# Patient Record
Sex: Female | Born: 1960 | Hispanic: Yes | Marital: Married | Smoking: Never smoker
Health system: Southern US, Community
[De-identification: ages and names within clinical notes are randomized; demographics above are authoritative.]

## PROBLEM LIST (undated history)

## (undated) DIAGNOSIS — K7581 Nonalcoholic steatohepatitis (NASH): Secondary | ICD-10-CM

## (undated) DIAGNOSIS — M549 Dorsalgia, unspecified: Secondary | ICD-10-CM

## (undated) DIAGNOSIS — Z923 Personal history of irradiation: Secondary | ICD-10-CM

## (undated) DIAGNOSIS — I10 Essential (primary) hypertension: Secondary | ICD-10-CM

## (undated) DIAGNOSIS — E079 Disorder of thyroid, unspecified: Secondary | ICD-10-CM

## (undated) HISTORY — PX: BACK SURGERY: SHX140

---

## 2015-08-28 ENCOUNTER — Emergency Department (HOSPITAL_COMMUNITY)
Admission: EM | Admit: 2015-08-28 | Discharge: 2015-08-28 | Disposition: A | Discharge status: Home / Self Care | Payer: Medicare HMO | Attending: Emergency Medicine | Admitting: Emergency Medicine

## 2015-08-28 ENCOUNTER — Encounter (HOSPITAL_COMMUNITY): Payer: Self-pay

## 2015-08-28 DIAGNOSIS — M549 Dorsalgia, unspecified: Secondary | ICD-10-CM | POA: Diagnosis present

## 2015-08-28 DIAGNOSIS — I10 Essential (primary) hypertension: Secondary | ICD-10-CM | POA: Diagnosis not present

## 2015-08-28 DIAGNOSIS — Z880 Allergy status to penicillin: Secondary | ICD-10-CM | POA: Diagnosis not present

## 2015-08-28 DIAGNOSIS — Z8719 Personal history of other diseases of the digestive system: Secondary | ICD-10-CM | POA: Diagnosis not present

## 2015-08-28 DIAGNOSIS — Z9889 Other specified postprocedural states: Secondary | ICD-10-CM | POA: Diagnosis not present

## 2015-08-28 DIAGNOSIS — Z8639 Personal history of other endocrine, nutritional and metabolic disease: Secondary | ICD-10-CM | POA: Insufficient documentation

## 2015-08-28 DIAGNOSIS — M5432 Sciatica, left side: Secondary | ICD-10-CM | POA: Diagnosis not present

## 2015-08-28 HISTORY — DX: Disorder of thyroid, unspecified: E07.9

## 2015-08-28 HISTORY — DX: Dorsalgia, unspecified: M54.9

## 2015-08-28 HISTORY — DX: Nonalcoholic steatohepatitis (NASH): K75.81

## 2015-08-28 HISTORY — DX: Essential (primary) hypertension: I10

## 2015-08-28 MED ORDER — NAPROXEN 500 MG PO TABS: 500.0000 mg | ORAL_TABLET | Freq: Two times a day (BID) | ORAL | Status: AC

## 2015-08-28 MED ORDER — DIAZEPAM 5 MG PO TABS: 5.0000 mg | ORAL_TABLET | Freq: Two times a day (BID) | ORAL | Status: AC

## 2015-08-28 MED ORDER — KETOROLAC TROMETHAMINE 60 MG/2ML IM SOLN
60.0000 mg | Freq: Once | INTRAMUSCULAR | Status: AC
  Administered 2015-08-28: 60 mg via INTRAMUSCULAR
  Filled 2015-08-28: qty 2

## 2015-08-28 MED ORDER — DIAZEPAM 5 MG PO TABS
5.0000 mg | ORAL_TABLET | Freq: Once | ORAL | Status: AC
  Administered 2015-08-28: 5 mg via ORAL
  Filled 2015-08-28: qty 1

## 2015-08-28 NOTE — ED Notes (Signed)
Declined W/C at D/C and was escorted to lobby by RN. 

## 2015-08-28 NOTE — ED Notes (Signed)
Pt taking Relafen for pain with no relief of pain

## 2015-08-28 NOTE — Discharge Instructions (Signed)
Ms. Diane Robles,  [place] Please follow-up with your primary care provider. Return to the emergency department if you develop fevers, chills, loss of bladder/bowel control, increased pain, inability to walk, new/worsening symptoms. Feel better soon!  S. Lane HackerNicole Shaquisha Wynn, PA-C

## 2015-08-28 NOTE — ED Provider Notes (Signed)
CSN: [id number]     Arrival date & time 08/28/15  1129 History   First MD Initiated Contact with Patient 08/28/15 1240     Chief Complaint  Patient presents with  . Back Pain   HPI   Diane Robles is a 55 y.o. female PMH significant for back pain, HTN presenting with a 3 day history of left sided back pain.  She describes her pain as 10/10 pain scale, constant, radiating down left leg, similar to sciatica pain she's had in the past, worsened with working in her garden. She denies fevers, chills, nausea, vomiting, loss of bowel or bladder control.  Past Medical History  Diagnosis Date  . Back pain   . Hypertension   . Thyroid disease   . NASH (nonalcoholic steatohepatitis)    Past Surgical History  Procedure Laterality Date  . Back surgery     No family history on file. Social History  Substance Use Topics  . Smoking status: Never Smoker   . Smokeless tobacco: None  . Alcohol Use: None   OB History    No data available     Review of Systems  Ten systems are reviewed and are negative for acute change except as noted in the HPI  Allergies  Ivp dye; Penicillins; and Sulfa antibiotics  Home Medications   Prior to Admission medications   Not on File   BP 157/84 mmHg  Pulse 72  Temp(Src) 98.6 F (37 C) (Oral)  Resp 18  Ht 5\' 2"  (1.575 m)  Wt 68.04 kg  BMI 27.43 kg/m2  SpO2 100% Physical Exam  Constitutional: She is oriented to person, place, and time. She appears well-developed and well-nourished. No distress.  HENT:  Head: Normocephalic and atraumatic.  Eyes: Conjunctivae are normal. Right eye exhibits no discharge. Left eye exhibits no discharge. No scleral icterus.  Neck: No tracheal deviation present.  Cardiovascular: Normal rate.   Pulmonary/Chest: Effort normal. No respiratory distress.  Abdominal: Soft. She exhibits no distension.  Musculoskeletal: She exhibits tenderness. She exhibits no edema.  Left SI tenderness  Lymphadenopathy:    She  has no cervical adenopathy.  Neurological: She is alert and oriented to person, place, and time. No cranial nerve deficit. Coordination normal.  Skin: Skin is warm and dry. No rash noted. She is not diaphoretic. No erythema.  Psychiatric: She has a normal mood and affect. Her behavior is normal.  Nursing note and vitals reviewed.   ED Course  Procedures   MDM   Final diagnoses:  Sciatica of left side   Normal neurological exam, no evidence of urinary incontinence or retention, pain is consistently reproducible. There is no evidence of AAA or concern for dissection at this time.   Patient can walk but states is painful.  No loss of bowel or bladder control.  No concern for cauda equina.  No fever, night sweats, weight loss, h/o cancer, IVDU.  Pain treated here in the department with adequate improvement. RICE protocol and pain medicine indicated and discussed with patient. I have also discussed reasons to return immediately to the ER.  Patient expresses understanding and agrees with plan.    Melton KrebsSamantha Nicole Zakkary Thibault, PA-C 08/28/15 1318  Pricilla LovelessScott Goldston, MD 08/29/15 [phone]

## 2015-08-28 NOTE — ED Notes (Addendum)
Patient here with complaint of back pain for the past 2-3 days. Pain more  left sided pain radiating down left leg. Has history of back problems. Thinks working in the garden aggravated her back. Pain with any ambulation. Will need interpretor phone

## 2016-09-07 HISTORY — PX: BREAST LUMPECTOMY: SHX2

## 2016-11-07 HISTORY — PX: BREAST LUMPECTOMY: SHX2

## 2018-11-29 ENCOUNTER — Other Ambulatory Visit: Payer: Self-pay

## 2018-11-29 DIAGNOSIS — Z20822 Contact with and (suspected) exposure to covid-19: Secondary | ICD-10-CM

## 2018-11-30 LAB — NOVEL CORONAVIRUS, NAA: SARS-CoV-2, NAA: NOT DETECTED

## 2019-05-11 ENCOUNTER — Other Ambulatory Visit: Payer: Self-pay | Admitting: Radiation Oncology

## 2019-05-12 ENCOUNTER — Other Ambulatory Visit: Payer: Self-pay | Admitting: Internal Medicine

## 2019-05-12 DIAGNOSIS — R921 Mammographic calcification found on diagnostic imaging of breast: Secondary | ICD-10-CM

## 2019-05-12 DIAGNOSIS — Z853 Personal history of malignant neoplasm of breast: Secondary | ICD-10-CM

## 2019-05-12 DIAGNOSIS — Z9889 Other specified postprocedural states: Secondary | ICD-10-CM

## 2019-05-14 ENCOUNTER — Ambulatory Visit
Admission: RE | Admit: 2019-05-14 | Discharge: 2019-05-14 | Disposition: A | Discharge status: Home / Self Care | Payer: Medicare HMO | Source: Ambulatory Visit | Attending: Internal Medicine | Admitting: Internal Medicine

## 2019-05-14 ENCOUNTER — Other Ambulatory Visit: Payer: Self-pay | Admitting: Internal Medicine

## 2019-05-14 ENCOUNTER — Other Ambulatory Visit: Payer: Self-pay

## 2019-05-14 DIAGNOSIS — Z9889 Other specified postprocedural states: Secondary | ICD-10-CM

## 2019-05-14 DIAGNOSIS — Z853 Personal history of malignant neoplasm of breast: Secondary | ICD-10-CM

## 2019-05-14 DIAGNOSIS — R921 Mammographic calcification found on diagnostic imaging of breast: Secondary | ICD-10-CM

## 2019-05-14 HISTORY — DX: Personal history of irradiation: Z92.3

## 2019-05-18 ENCOUNTER — Encounter (HOSPITAL_COMMUNITY): Payer: Self-pay

## 2019-11-13 ENCOUNTER — Ambulatory Visit
Admission: RE | Admit: 2019-11-13 | Discharge: 2019-11-13 | Disposition: A | Discharge status: Home / Self Care | Payer: Medicare HMO | Source: Ambulatory Visit | Attending: Internal Medicine | Admitting: Internal Medicine

## 2019-11-13 ENCOUNTER — Other Ambulatory Visit: Payer: Self-pay | Admitting: Internal Medicine

## 2019-11-13 ENCOUNTER — Other Ambulatory Visit: Payer: Self-pay

## 2019-11-13 DIAGNOSIS — Z853 Personal history of malignant neoplasm of breast: Secondary | ICD-10-CM

## 2019-11-13 DIAGNOSIS — Z9889 Other specified postprocedural states: Secondary | ICD-10-CM

## 2019-11-13 DIAGNOSIS — R921 Mammographic calcification found on diagnostic imaging of breast: Secondary | ICD-10-CM

## 2019-11-17 ENCOUNTER — Ambulatory Visit
Admission: RE | Admit: 2019-11-17 | Discharge: 2019-11-17 | Disposition: A | Discharge status: Home / Self Care | Payer: Medicare HMO | Source: Ambulatory Visit | Attending: Internal Medicine | Admitting: Internal Medicine

## 2019-11-17 ENCOUNTER — Other Ambulatory Visit: Payer: Self-pay

## 2019-11-17 DIAGNOSIS — Z9889 Other specified postprocedural states: Secondary | ICD-10-CM

## 2019-11-17 DIAGNOSIS — Z853 Personal history of malignant neoplasm of breast: Secondary | ICD-10-CM

## 2019-11-17 DIAGNOSIS — R921 Mammographic calcification found on diagnostic imaging of breast: Secondary | ICD-10-CM

## 2019-11-17 HISTORY — PX: BREAST BIOPSY: SHX20

## 2019-12-11 ENCOUNTER — Other Ambulatory Visit: Payer: Self-pay | Admitting: Critical Care Medicine

## 2019-12-11 ENCOUNTER — Other Ambulatory Visit: Payer: Self-pay

## 2019-12-11 DIAGNOSIS — Z20822 Contact with and (suspected) exposure to covid-19: Secondary | ICD-10-CM

## 2019-12-12 LAB — NOVEL CORONAVIRUS, NAA: SARS-CoV-2, NAA: NOT DETECTED

## 2019-12-15 ENCOUNTER — Other Ambulatory Visit: Payer: Self-pay

## 2020-11-11 ENCOUNTER — Other Ambulatory Visit: Payer: Self-pay | Admitting: Internal Medicine

## 2020-11-11 DIAGNOSIS — Z9889 Other specified postprocedural states: Secondary | ICD-10-CM

## 2020-11-21 ENCOUNTER — Other Ambulatory Visit: Payer: Self-pay | Admitting: Internal Medicine

## 2020-11-21 DIAGNOSIS — Z853 Personal history of malignant neoplasm of breast: Secondary | ICD-10-CM

## 2020-11-21 DIAGNOSIS — Z9889 Other specified postprocedural states: Secondary | ICD-10-CM

## 2020-11-24 ENCOUNTER — Other Ambulatory Visit: Payer: Self-pay

## 2020-11-24 ENCOUNTER — Ambulatory Visit
Admission: RE | Admit: 2020-11-24 | Discharge: 2020-11-24 | Disposition: A | Discharge status: Home / Self Care | Payer: Medicare HMO | Source: Ambulatory Visit | Attending: Internal Medicine | Admitting: Internal Medicine

## 2020-11-24 DIAGNOSIS — Z9889 Other specified postprocedural states: Secondary | ICD-10-CM

## 2020-11-24 DIAGNOSIS — Z853 Personal history of malignant neoplasm of breast: Secondary | ICD-10-CM

## 2021-09-07 ENCOUNTER — Encounter: Payer: Self-pay | Admitting: Podiatry

## 2021-09-07 ENCOUNTER — Ambulatory Visit (INDEPENDENT_AMBULATORY_CARE_PROVIDER_SITE_OTHER): Payer: Medicare HMO

## 2021-09-07 ENCOUNTER — Ambulatory Visit: Payer: Medicare HMO | Admitting: Podiatry

## 2021-09-07 DIAGNOSIS — M21611 Bunion of right foot: Secondary | ICD-10-CM

## 2021-09-07 DIAGNOSIS — M21619 Bunion of unspecified foot: Secondary | ICD-10-CM

## 2021-09-08 ENCOUNTER — Telehealth: Payer: Self-pay | Admitting: Urology

## 2021-09-08 NOTE — Telephone Encounter (Signed)
DOS - 09/12/21  [place] BUNIONECTOMY RIGHT --- [id number]  [employer] EFFECTIVE DATE - 04/09/20  SPOKE WITH MAX WITH [employer] AND HE STATED THAT FOR CPT CODE 46962 NO PRIOR AUTH IS REQUIRED.  REF # [id number]

## 2021-09-10 NOTE — Progress Notes (Signed)
Subjective:   Patient ID: Diane Robles, female   DOB: 60 y.o.   MRN: [id number]   HPI Patient presents with caregiver with very painful bunion deformity right which has been present for several years with family history and mother having had this with patient who has tried wider shoes soaks and padding of the area without relief of symptoms and is having trouble wearing shoe gear currently.  She presents with interpreter also does not smoke likes to be active   Review of Systems  All other systems reviewed and are negative.      Objective:  Physical Exam Vitals and nursing note reviewed.  Constitutional:      Appearance: She is well-developed.  Pulmonary:     Effort: Pulmonary effort is normal.  Musculoskeletal:        General: Normal range of motion.  Skin:    General: Skin is warm.  Neurological:     Mental Status: She is alert.    Neurovascular status intact muscle strength found to be adequate range of motion adequate with patient found to have hyperostosis medial aspect first metatarsal head right with redness around the joint surface pain with pressure moderate deformity of the toe no restriction of motion of the joint currently or crepitus.  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  I do think given her long-term history pain and history with family that surgical intervention is indicated and this is what she wants and not interested in any conservative care.  I reviewed the x-ray with her I allowed her to read consent form and I went over alternative treatments complications and she is willing to accept surgery and after extensive review signed consent form with all  questions answered.  Understands total recovery takes approximately 6 months an air fracture walker dispensed today fitted properly for her with all instructions given on usage and I want her to wear it prior to surgery to get used to it  X-rays indicate elevation of the intermetatarsal angle right with prominence of the first metatarsal head and bone changes around the first metatarsal head noted with swelling in the soft tissue

## 2021-09-11 MED ORDER — ONDANSETRON HCL 4 MG PO TABS: 4.0000 mg | ORAL_TABLET | Freq: Three times a day (TID) | ORAL | 0 refills | Status: AC | PRN

## 2021-09-11 MED ORDER — OXYCODONE-ACETAMINOPHEN 10-325 MG PO TABS: 1.0000 | ORAL_TABLET | ORAL | 0 refills | Status: AC | PRN

## 2021-09-11 NOTE — Addendum Note (Signed)
Addended by: Lenn Sink on: 09/11/2021 01:22 PM   Modules accepted: Orders

## 2021-09-12 ENCOUNTER — Encounter: Payer: Self-pay | Admitting: Podiatry

## 2021-09-12 DIAGNOSIS — M2011 Hallux valgus (acquired), right foot: Secondary | ICD-10-CM | POA: Diagnosis not present

## 2021-09-18 ENCOUNTER — Ambulatory Visit (INDEPENDENT_AMBULATORY_CARE_PROVIDER_SITE_OTHER): Payer: Medicare HMO | Admitting: Podiatry

## 2021-09-18 ENCOUNTER — Encounter: Payer: Self-pay | Admitting: Podiatry

## 2021-09-18 ENCOUNTER — Ambulatory Visit (INDEPENDENT_AMBULATORY_CARE_PROVIDER_SITE_OTHER): Payer: Medicare HMO

## 2021-09-18 DIAGNOSIS — M21611 Bunion of right foot: Secondary | ICD-10-CM

## 2021-09-18 DIAGNOSIS — M21619 Bunion of unspecified foot: Secondary | ICD-10-CM

## 2021-09-18 NOTE — Progress Notes (Signed)
Subjective:   Patient ID: Diane Robles, female   DOB: 61 y.o.   MRN: [id number]   HPI Patient presents with interpreter stating she is doing very well with surgery   ROS      Objective:  Physical Exam  Neurovascular status intact wound edges well coapted hallux rectus position good range of motion with mild discomfort only upon full weightbearing     Assessment:  Doing well post osteotomy first metatarsal right foot wound edges well coapted     Plan:  H&P x-rays reviewed advised on continued immobilization elevation compression and patient will be seen back 4 weeks or earlier if needed  X-rays indicate osteotomies healing well fixation in place joint congruence

## 2021-09-21 ENCOUNTER — Encounter: Payer: Self-pay | Admitting: Podiatry

## 2021-10-03 ENCOUNTER — Encounter: Payer: Self-pay | Admitting: Podiatry

## 2021-10-03 ENCOUNTER — Telehealth: Payer: Self-pay | Admitting: Podiatry

## 2021-10-05 NOTE — Telephone Encounter (Signed)
Notified patient thru interpreter(daughter) verbalized understanding, rescheduled the appointment for MRI but now has information needed.

## 2021-10-16 ENCOUNTER — Ambulatory Visit: Payer: Medicare HMO | Admitting: Podiatry

## 2021-10-16 ENCOUNTER — Ambulatory Visit (INDEPENDENT_AMBULATORY_CARE_PROVIDER_SITE_OTHER): Payer: Medicare HMO

## 2021-10-16 DIAGNOSIS — M21611 Bunion of right foot: Secondary | ICD-10-CM

## 2021-10-16 DIAGNOSIS — M21619 Bunion of unspecified foot: Secondary | ICD-10-CM

## 2021-10-16 NOTE — Progress Notes (Signed)
Subjective:   Patient ID: Diane Robles, female   DOB: 61 y.o.   MRN: [id number]   HPI Patient states doing very well with surgery very pleased but did have questions   ROS      Objective:  Physical Exam  Vascular status intact negative Denna Haggard' sign noted wound edges are coapted well with ends intact good range of motion no crepitus     Assessment:  Healing well post osteotomy first metatarsal right     Plan:  X-rays indicate excellent healing of bone good positional component good reduction of deformity with patient to now return to normal shoe gear increased activity in total recovery should take another 3 months and then that should be the end of it encouraged to call questions concerns

## 2021-12-04 ENCOUNTER — Other Ambulatory Visit: Payer: Self-pay | Admitting: Internal Medicine

## 2021-12-04 DIAGNOSIS — Z9889 Other specified postprocedural states: Secondary | ICD-10-CM

## 2021-12-26 ENCOUNTER — Ambulatory Visit
Admission: RE | Admit: 2021-12-26 | Discharge: 2021-12-26 | Disposition: A | Discharge status: Home / Self Care | Payer: Medicare HMO | Source: Ambulatory Visit | Attending: Internal Medicine | Admitting: Internal Medicine

## 2021-12-26 DIAGNOSIS — Z9889 Other specified postprocedural states: Secondary | ICD-10-CM

## 2022-11-27 ENCOUNTER — Other Ambulatory Visit: Payer: Self-pay | Admitting: Family Medicine

## 2022-11-27 DIAGNOSIS — Z0000 Encounter for general adult medical examination without abnormal findings: Secondary | ICD-10-CM

## 2023-01-11 ENCOUNTER — Ambulatory Visit
Admission: RE | Admit: 2023-01-11 | Discharge: 2023-01-11 | Disposition: A | Discharge status: Home / Self Care | Payer: Medicare HMO | Source: Ambulatory Visit | Attending: Family Medicine

## 2023-01-11 DIAGNOSIS — Z0000 Encounter for general adult medical examination without abnormal findings: Secondary | ICD-10-CM

## 2023-01-24 IMAGING — MG DIGITAL DIAGNOSTIC BILAT W/ TOMO W/ CAD
9 series · 9 of 25 positions shown · non-contrast
Comparison: Previous exam(s).

CLINICAL DATA: 60-year-old female status post malignant left
lumpectomy in 0826 and benign left breast biopsy 1 year ago.

EXAM:
DIGITAL DIAGNOSTIC BILATERAL MAMMOGRAM WITH TOMOSYNTHESIS AND CAD
TECHNIQUE: Bilateral digital diagnostic mammography and breast tomosynthesis
was performed. The images were evaluated with computer-aided
detection.

[L MLO]
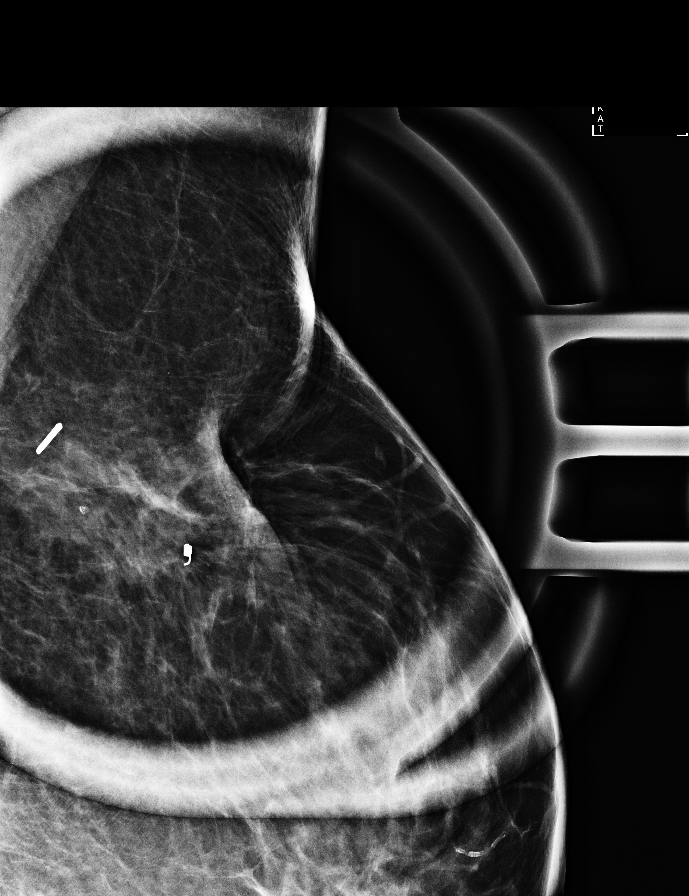

[L CC synth-2D]
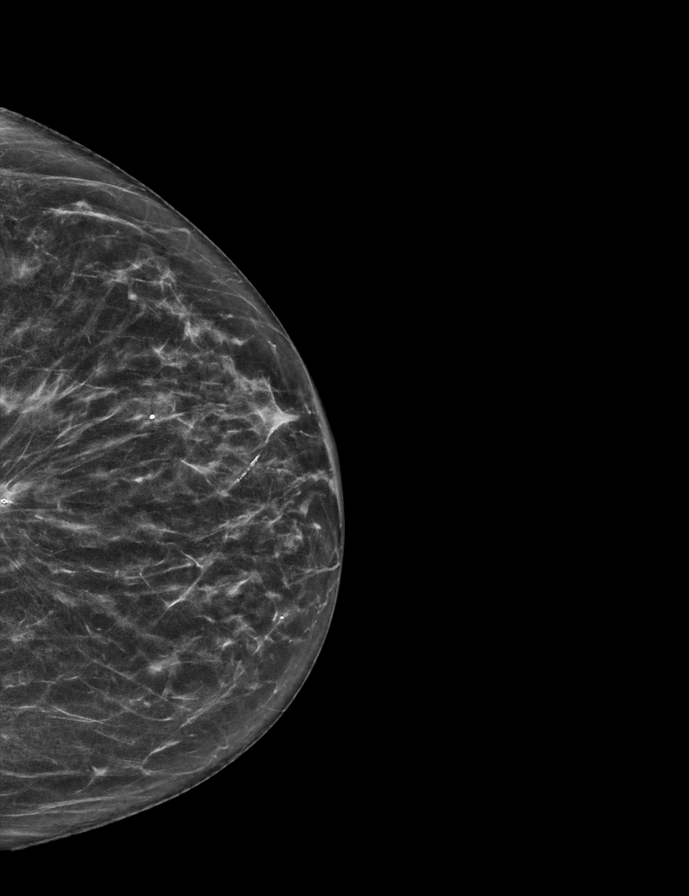

[L MLO synth-2D]
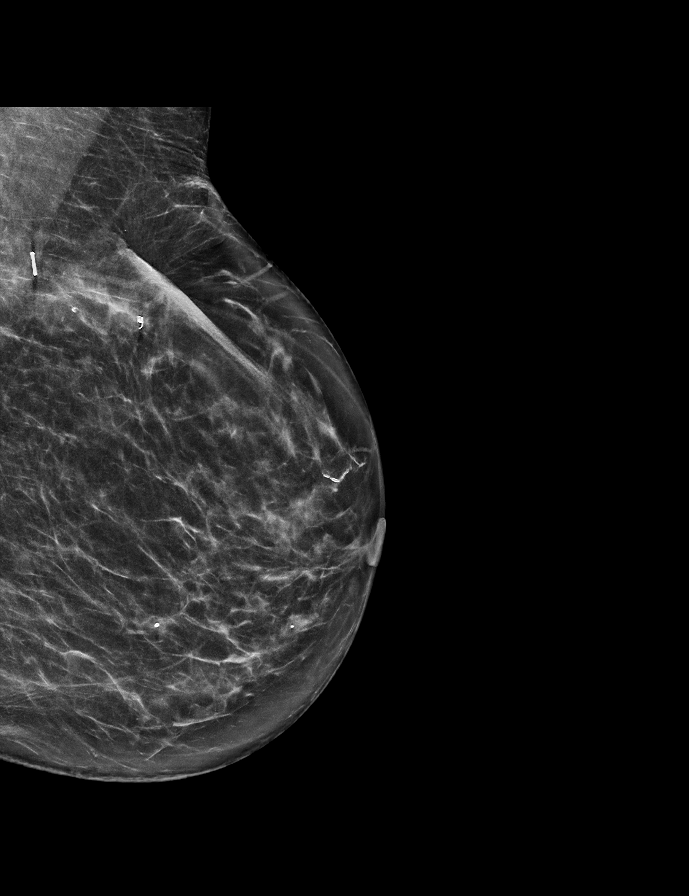

[R CC synth-2D]
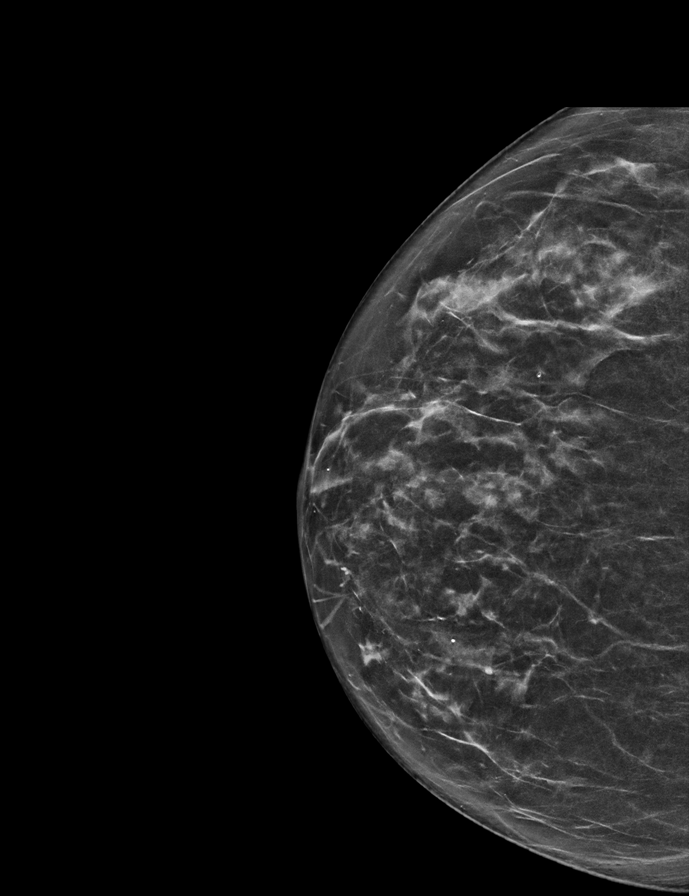

[R MLO synth-2D]
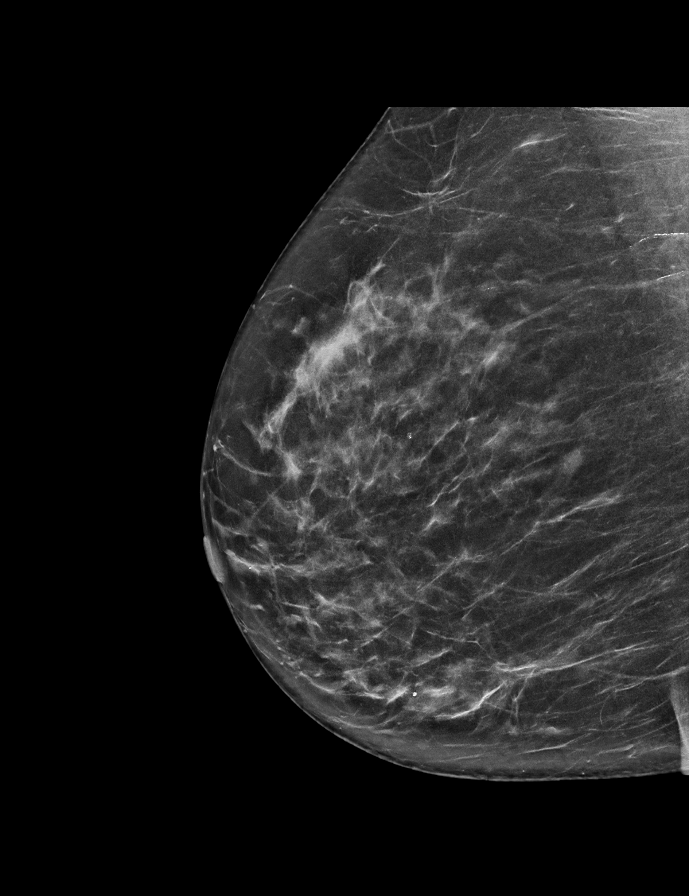

[R MLO tomo · tomo slice 35/68.0]
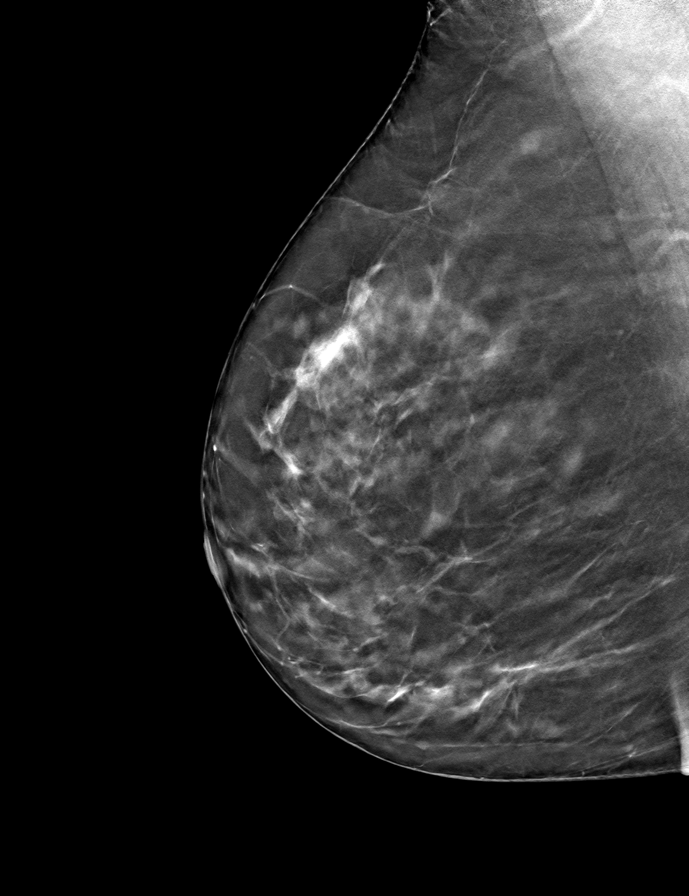

[L CC tomo · tomo slice 27/54.0]
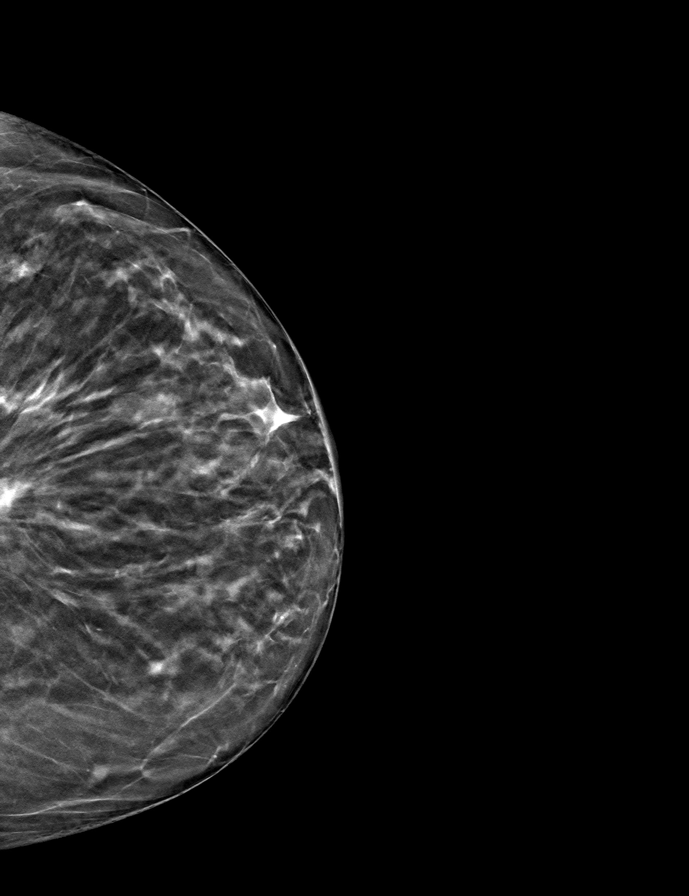

[R CC tomo · tomo slice 30/59.0]
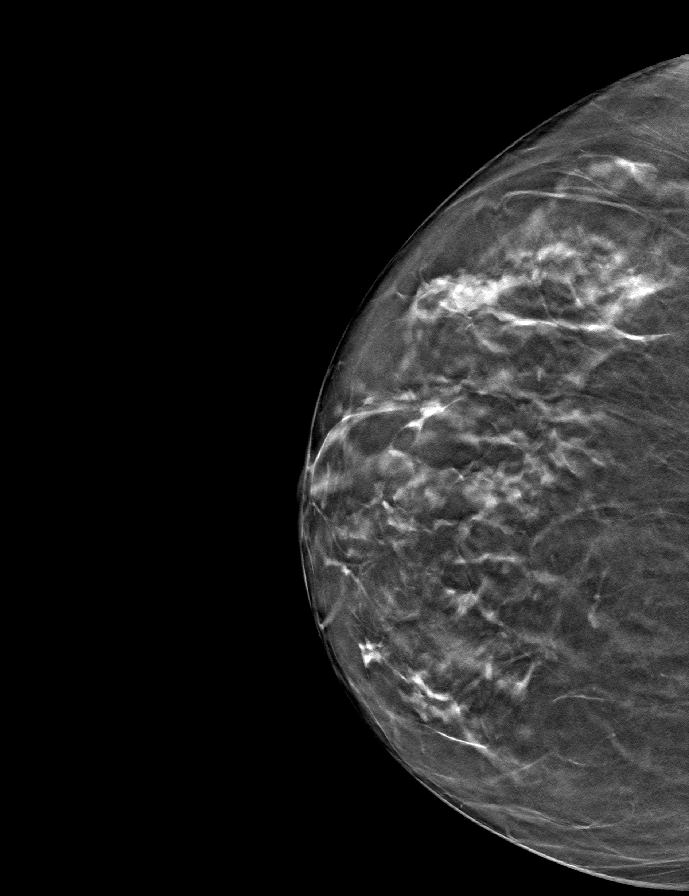

[L MLO tomo · tomo slice 33/64.0]
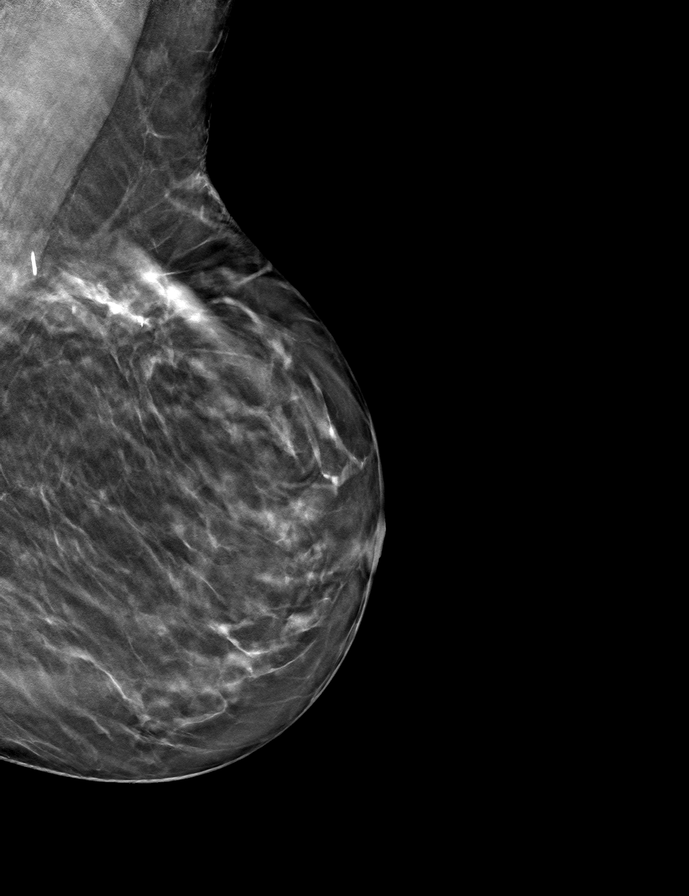

[9 of 25 positions shown; findings below may reference images not displayed]

ACR Breast Density Category b: There are scattered areas of
fibroglandular density.
FINDINGS: Stable post lumpectomy interval post biopsy changes are demonstrated
in the deep upper central left breast. No new or suspicious findings
are identified in either breast.
IMPRESSION: 1. No mammographic evidence of malignancy in either breast.
2. Left breast posttreatment changes.

RECOMMENDATION:
Per protocol, as the patient is now 2 or more years status post
lumpectomy, she may return to annual screening mammography in 1
year. However, given the history of breast cancer, the patient
remains eligible for annual diagnostic mammography if preferred.
(Code:8D-1-5JW)

I have discussed the findings and recommendations with the patient.
If applicable, a reminder letter will be sent to the patient
regarding the next appointment.

BI-RADS CATEGORY  2: Benign.

## 2023-11-18 ENCOUNTER — Other Ambulatory Visit: Payer: Self-pay | Admitting: Family Medicine

## 2023-11-18 DIAGNOSIS — Z1231 Encounter for screening mammogram for malignant neoplasm of breast: Secondary | ICD-10-CM

## 2024-01-15 ENCOUNTER — Ambulatory Visit
Admission: RE | Admit: 2024-01-15 | Discharge: 2024-01-15 | Disposition: A | Discharge status: Home / Self Care | Source: Ambulatory Visit | Attending: Family Medicine | Admitting: Family Medicine

## 2024-01-15 DIAGNOSIS — Z1231 Encounter for screening mammogram for malignant neoplasm of breast: Secondary | ICD-10-CM
